# Patient Record
Sex: Male | Born: 1955 | Race: White | Hispanic: No | Marital: Married | Smoking: Never smoker
Health system: Southern US, Community
[De-identification: ages and names within clinical notes are randomized; demographics above are authoritative.]

## PROBLEM LIST (undated history)

## (undated) DIAGNOSIS — I2699 Other pulmonary embolism without acute cor pulmonale: Secondary | ICD-10-CM

## (undated) DIAGNOSIS — I10 Essential (primary) hypertension: Secondary | ICD-10-CM

## (undated) HISTORY — PX: NO PAST SURGERIES: SHX2092

---

## 2006-08-24 ENCOUNTER — Ambulatory Visit: Payer: Self-pay | Admitting: Emergency Medicine

## 2006-08-24 ENCOUNTER — Other Ambulatory Visit: Payer: Self-pay

## 2006-08-24 ENCOUNTER — Emergency Department: Payer: Self-pay | Admitting: General Practice

## 2007-09-18 ENCOUNTER — Ambulatory Visit: Payer: Self-pay | Admitting: Gastroenterology

## 2007-12-08 IMAGING — CR DG CHEST 2V
1 series · 4 of 4 positions shown · non-contrast
Comparison: none

REASON FOR EXAM: chest pain
COMMENTS:

PROCEDURE:     MDR - MDR CHEST PA(OR AP) AND LATERAL  - August 24, 2006 [DATE]
RESULT:     No acute cardiopulmonary disease noted.

[Series 1: view not recorded · 0.17mm/px · 4 of 4 slices shown]
[im 1/4]
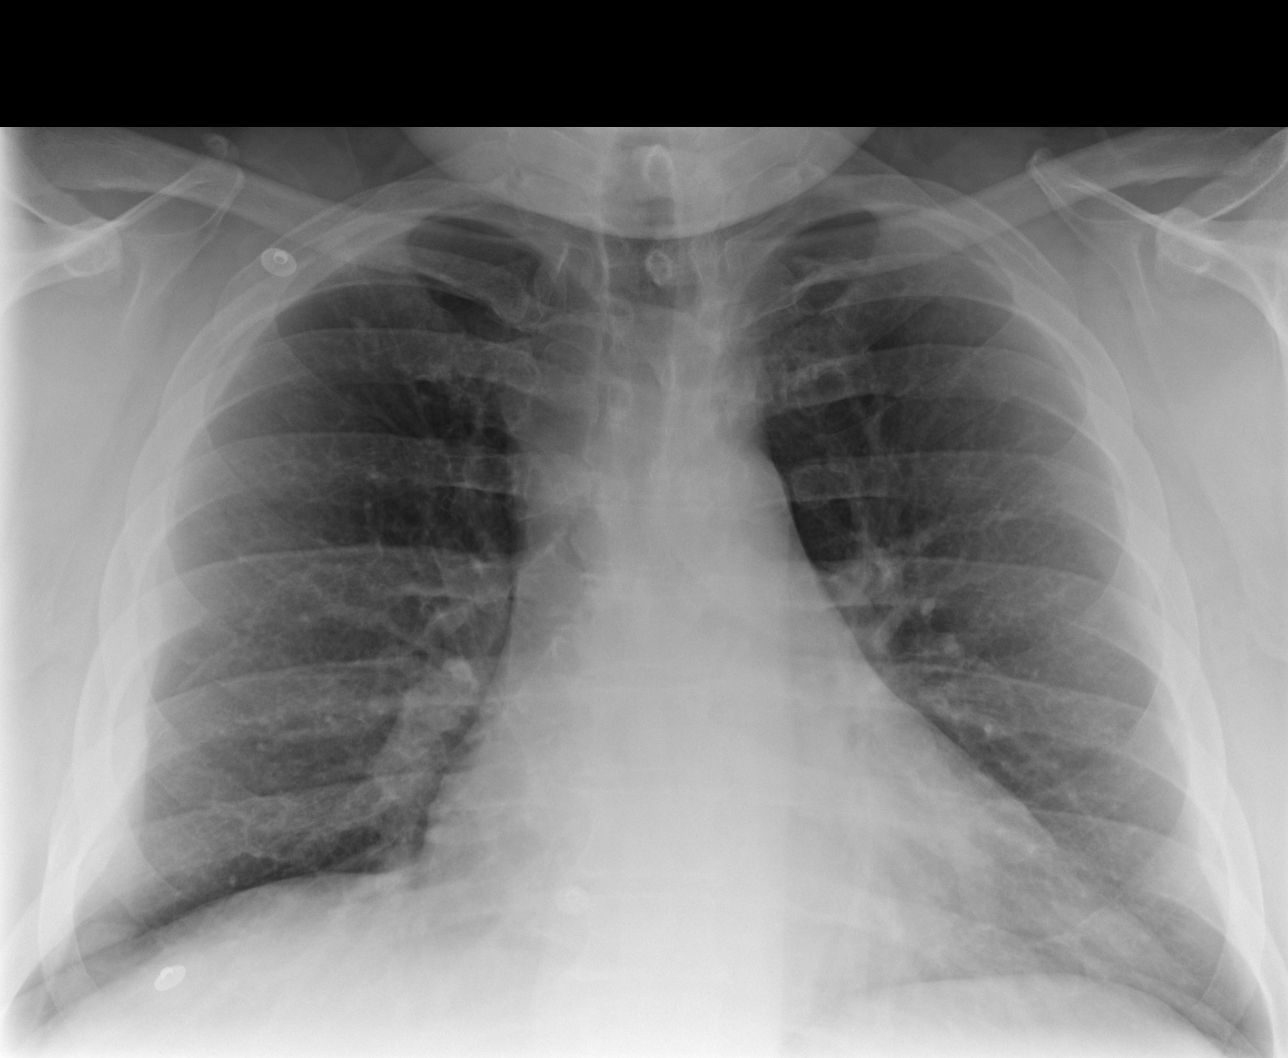
[im 2/4]
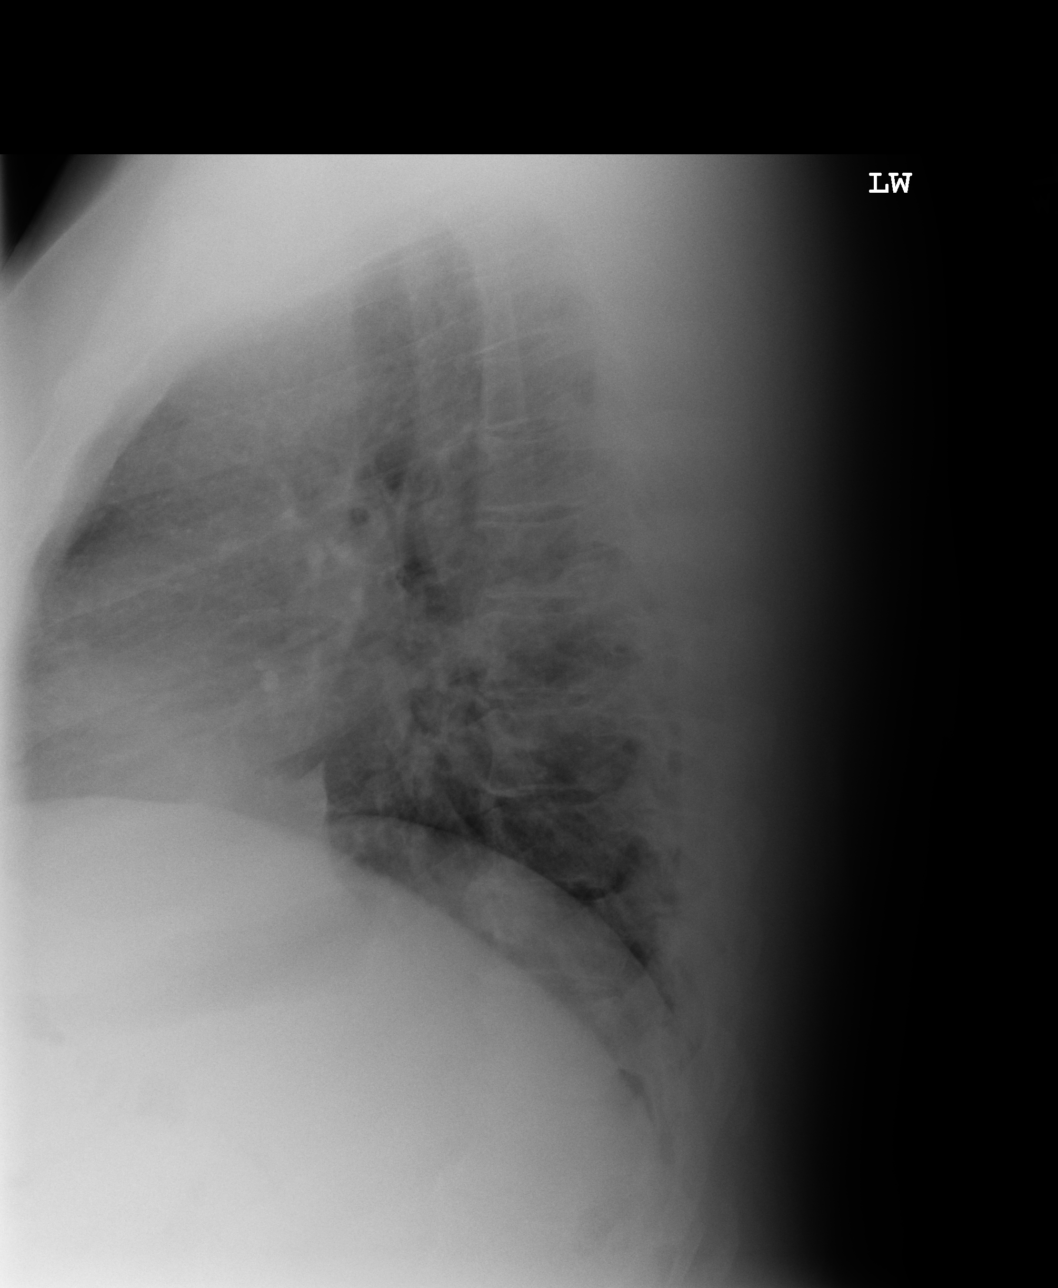
[im 3/4]
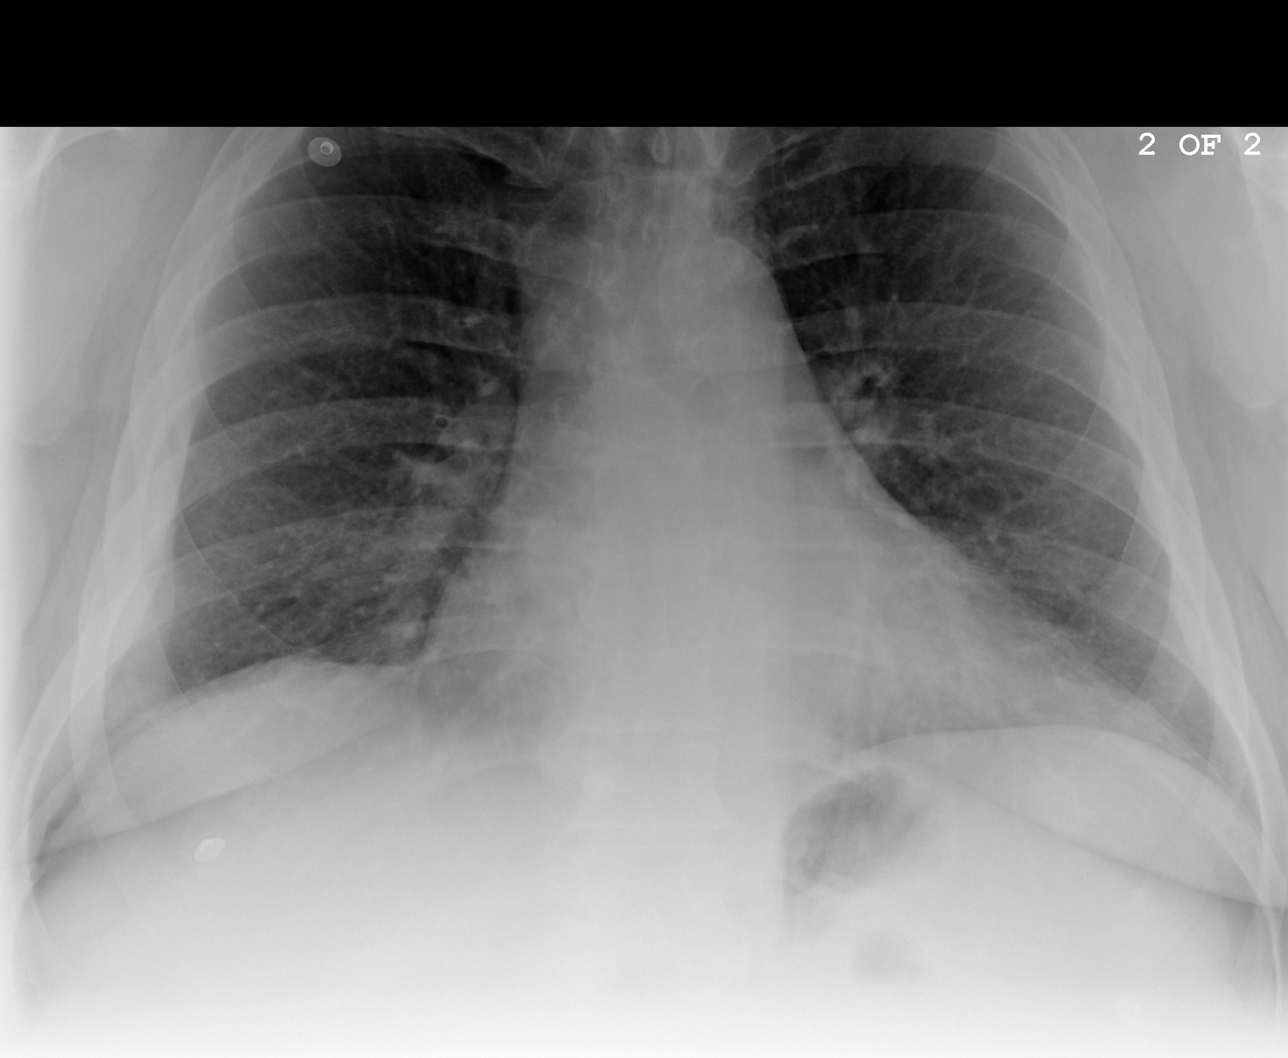
[im 4/4]
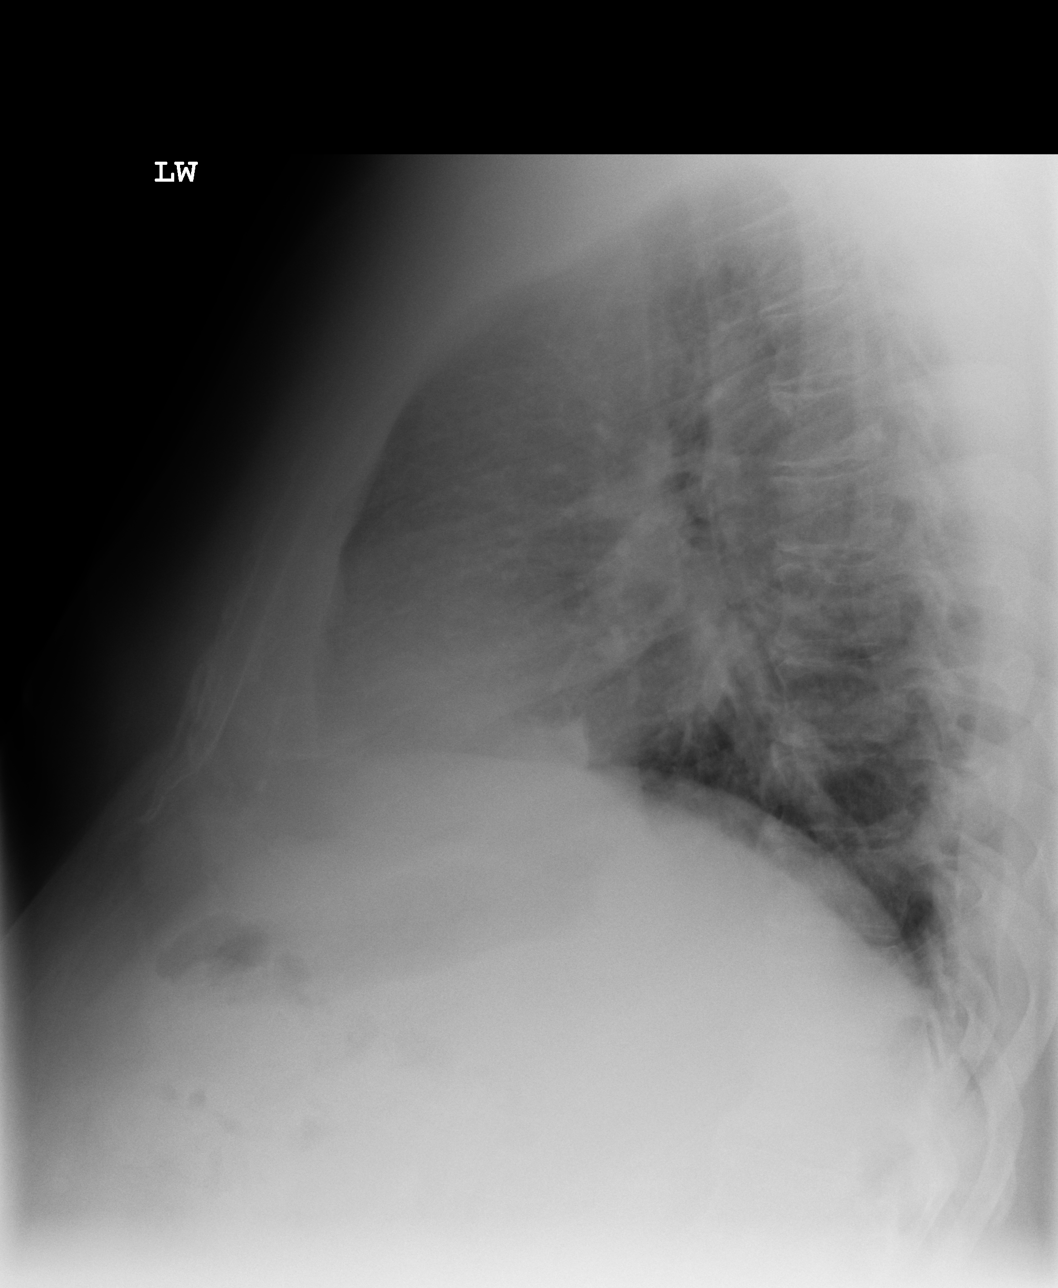

[4 of 4 positions shown; findings below may reference images not displayed]

IMPRESSION: 1)No acute cardiopulmonary disease.

## 2019-05-18 ENCOUNTER — Other Ambulatory Visit: Payer: Self-pay

## 2019-05-18 ENCOUNTER — Ambulatory Visit
Admission: EM | Admit: 2019-05-18 | Discharge: 2019-05-18 | Disposition: A | Discharge status: Home / Self Care | Payer: BC Managed Care – PPO | Attending: Internal Medicine | Admitting: Internal Medicine

## 2019-05-18 DIAGNOSIS — A059 Bacterial foodborne intoxication, unspecified: Secondary | ICD-10-CM | POA: Diagnosis not present

## 2019-05-18 HISTORY — DX: Essential (primary) hypertension: I10

## 2019-05-18 HISTORY — DX: Other pulmonary embolism without acute cor pulmonale: I26.99

## 2019-05-18 NOTE — ED Triage Notes (Signed)
Patient states that this morning he went to [employer] and started having diarrhea after eating. Patient reports that his job sent him home for the diarrhea and they are requiring him to have a doctors note before he is allowed to return.

## 2019-05-18 NOTE — ED Provider Notes (Signed)
[facility]    CSN: [id number] Arrival date & time: 05/18/19  1712      History   Chief Complaint Chief Complaint  Patient presents with  . Diarrhea    HPI Carlos Cunningham is a 64 y.o. male comes to urgent care with 1 day history of diarrhea which started 2 hours after he ate some sausage and biscuit from [employer].  Patient has had several episodes of diarrhea.  He denies any nausea, vomiting or abdominal pain.  Diarrhea has subsided.  No fever or chills.  Oral intake is preserved.  Patient was sent here to be evaluated prior to returning to work.  HPI  Past Medical History:  Diagnosis Date  . Hypertension   . Pulmonary embolism (HCC)     There are no problems to display for this patient.   Past Surgical History:  Procedure Laterality Date  . NO PAST SURGERIES         Home Medications    Prior to Admission medications   Medication Sig Start Date End Date Taking? Authorizing Provider  ELIQUIS 2.5 MG TABS tablet Take 2.5 mg by mouth 2 (two) times daily. 05/13/19  Yes [provider]  hydrochlorothiazide (HYDRODIURIL) 12.5 MG tablet Take 12.5 mg by mouth daily. 03/18/19  Yes [provider]  metFORMIN (GLUCOPHAGE) 1000 MG tablet Take 1,000 mg by mouth 2 (two) times daily. 05/13/19  Yes [provider]  VICTOZA 18 MG/3ML SOPN Inject 1.8 mg into the skin daily. 05/12/19  Yes [provider]    Family History Family History  Problem Relation Age of Onset  . Leukemia Father     Social History Social History   Tobacco Use  . Smoking status: Never Smoker  . Smokeless tobacco: Never Used  Substance Use Topics  . Alcohol use: Never  . Drug use: Never     Allergies   Patient has no known allergies.   Review of Systems Review of Systems  Constitutional: Negative for activity change, chills and fever.  Gastrointestinal: Positive for diarrhea. Negative for abdominal pain, nausea and vomiting.  Musculoskeletal:  Negative.      Physical Exam Triage Vital Signs ED Triage Vitals  Enc Vitals Group     BP 05/18/19 1739 (!) 135/94     Pulse Rate 05/18/19 1739 91     Resp 05/18/19 1739 16     Temp 05/18/19 1739 98.6 F (37 C)     Temp Source 05/18/19 1739 Oral     SpO2 05/18/19 1739 99 %     Weight 05/18/19 1736 274 lb (124.3 kg)     Height 05/18/19 1736 6' (1.829 m)     Head Circumference --      Peak Flow --      Pain Score 05/18/19 1736 0     Pain Loc --      Pain Edu? --      Excl. in GC? --    No data found.  Updated Vital Signs BP (!) 135/94 (BP Location: Right Arm)   Pulse 91   Temp 98.6 F (37 C) (Oral)   Resp 16   Ht 6' (1.829 m)   Wt 124.3 kg   SpO2 99%   BMI 37.16 kg/m   Visual Acuity Right Eye Distance:   Left Eye Distance:   Bilateral Distance:    Right Eye Near:   Left Eye Near:    Bilateral Near:     Physical Exam Vitals and nursing  note reviewed.  Constitutional:      General: He is not in acute distress.    Appearance: Normal appearance. He is not ill-appearing.  Cardiovascular:     Rate and Rhythm: Normal rate and regular rhythm.     Pulses: Normal pulses.     Heart sounds: Normal heart sounds. No murmur. No friction rub.  Pulmonary:     Effort: Pulmonary effort is normal.     Breath sounds: Normal breath sounds. No stridor. No rales.  Chest:     Chest wall: No tenderness.  Neurological:     Mental Status: He is alert.      UC Treatments / Results  Labs (all labs ordered are listed, but only abnormal results are displayed) Labs Reviewed - No data to display  EKG   Radiology No results found.  Procedures Procedures (including critical care time)  Medications Ordered in UC Medications - No data to display  Initial Impression / Assessment and Plan / UC Course  I have reviewed the triage vital signs and the nursing notes.  Pertinent labs & imaging results that were available during my care of the patient were reviewed by me and  considered in my medical decision making (see chart for details).     1.  Food poisoning, resolved: Patient is encouraged to increase oral fluid intake Patient is able to return to work tomorrow.  Final Clinical Impressions(s) / UC Diagnoses   Final diagnoses:  Food poisoning   Discharge Instructions   None    ED Prescriptions    None     PDMP not reviewed this encounter.   Chase Picket, MD 05/18/19 [phone]

## 2019-10-28 ENCOUNTER — Other Ambulatory Visit: Payer: Self-pay

## 2019-10-28 ENCOUNTER — Other Ambulatory Visit: Payer: Self-pay | Admitting: Critical Care Medicine

## 2019-10-28 DIAGNOSIS — Z20822 Contact with and (suspected) exposure to covid-19: Secondary | ICD-10-CM

## 2019-10-29 LAB — NOVEL CORONAVIRUS, NAA: SARS-CoV-2, NAA: DETECTED — AB

## 2019-10-29 LAB — SARS-COV-2, NAA 2 DAY TAT

## 2020-12-28 ENCOUNTER — Other Ambulatory Visit (HOSPITAL_COMMUNITY): Payer: Self-pay

## 2020-12-28 MED ORDER — FLUARIX QUADRIVALENT 0.5 ML IM SUSY
PREFILLED_SYRINGE | INTRAMUSCULAR | 0 refills | Status: AC
  Filled 2020-12-28: qty 0.5, 1d supply, fill #0

## 2021-10-07 ENCOUNTER — Encounter: Payer: Self-pay | Admitting: Emergency Medicine

## 2021-10-07 ENCOUNTER — Ambulatory Visit
Admission: EM | Admit: 2021-10-07 | Discharge: 2021-10-07 | Disposition: A | Discharge status: Home / Self Care | Payer: BC Managed Care – PPO | Attending: Emergency Medicine | Admitting: Emergency Medicine

## 2021-10-07 DIAGNOSIS — U071 COVID-19: Secondary | ICD-10-CM | POA: Insufficient documentation

## 2021-10-07 LAB — SARS CORONAVIRUS 2 BY RT PCR: SARS Coronavirus 2 by RT PCR: POSITIVE — AB

## 2021-10-07 LAB — GROUP A STREP BY PCR: Group A Strep by PCR: NOT DETECTED

## 2021-10-07 MED ORDER — IPRATROPIUM BROMIDE 0.06 % NA SOLN: 2.0000 | Freq: Four times a day (QID) | NASAL | 12 refills | Status: AC

## 2021-10-07 MED ORDER — BENZONATATE 100 MG PO CAPS: 200.0000 mg | ORAL_CAPSULE | Freq: Three times a day (TID) | ORAL | 0 refills | Status: AC

## 2021-10-07 MED ORDER — PROMETHAZINE-DM 6.25-15 MG/5ML PO SYRP: 5.0000 mL | ORAL_SOLUTION | Freq: Four times a day (QID) | ORAL | 0 refills | Status: AC | PRN

## 2021-10-07 MED ORDER — MOLNUPIRAVIR EUA 200MG CAPSULE: 4.0000 | ORAL_CAPSULE | Freq: Two times a day (BID) | ORAL | 0 refills | Status: AC

## 2021-10-07 NOTE — ED Triage Notes (Signed)
Patient c/o sore throat that started yesterday.  Patient reports cold symptoms that started yesterday. Patient denies fevers.

## 2021-10-07 NOTE — Discharge Instructions (Addendum)
You will have to quarantine for 5 days from the start of your symptoms.  After 5 days you can break quarantine if your symptoms have improved and you have not had a fever for 24 hours without taking Tylenol or ibuprofen.  Use the Atrovent nasal spray, 2 squirts in each nostril every 6 hours, as needed for runny nose and postnasal drip.  Use the Tessalon Perles every 8 hours during the day.  Take them with a small sip of water.  They may give you some numbness to the base of your tongue or a metallic taste in your mouth, this is normal.  Use the Promethazine DM cough syrup at bedtime for cough and congestion.  It will make you drowsy so do not take it during the day.  Take the molnupiravir twice daily for 5 days for treatment of COVID-19.   Use the Tessalon Perles during the day as needed for cough and the Promethazine DM cough syrup at nighttime as will make you drowsy.  If you develop any increased shortness of breath-especially at rest, you are unable to speak in full sentences, or is a late sign your lips are turning blue you need to go the ER for evaluation.

## 2021-10-07 NOTE — ED Provider Notes (Signed)
[facility]    CSN: [id number] Arrival date & time: 10/07/21  8182      History   Chief Complaint Chief Complaint  Patient presents with   Sore Throat    HPI Brittan Mapel is a 66 y.o. male.   HPI  66 year old male here for evaluation of sore throat.  Patient reports that he began having a sore throat yesterday morning and that is his primary complaint.  This is associated with sneezing, runny nose, postnasal drip, itchy watery eyes, and a nonproductive cough.  He denies nasal congestion, ear pain, shortness of breath, or wheezing.  He does have a significant past medical history to include hypertension but his blood pressure is 146/79 here in clinic type 2 diabetes, and a pulmonary embolism.  He also is significantly hard of hearing.  He is not wearing hearing aids in clinic.  Past Medical History:  Diagnosis Date   Hypertension    Pulmonary embolism (HCC)     There are no problems to display for this patient.   Past Surgical History:  Procedure Laterality Date   NO PAST SURGERIES         Home Medications    Prior to Admission medications   Medication Sig Start Date End Date Taking? Authorizing Provider  benzonatate (TESSALON) 100 MG capsule Take 2 capsules (200 mg total) by mouth every 8 (eight) hours. 10/07/21  Yes Becky Augusta, NP  chlorthalidone (HYGROTON) 50 MG tablet Take 1 tablet by mouth daily. 06/12/21 06/12/22 Yes [provider]  ELIQUIS 2.5 MG TABS tablet Take 2.5 mg by mouth 2 (two) times daily. 05/13/19  Yes [provider]  glipiZIDE-metformin (METAGLIP) 5-500 MG tablet Take 1 tablet by mouth 2 (two) times daily. 09/23/21  Yes [provider]  hydrochlorothiazide (HYDRODIURIL) 12.5 MG tablet Take 12.5 mg by mouth daily. 03/18/19  Yes [provider]  ipratropium (ATROVENT) 0.06 % nasal spray Place 2 sprays into both nostrils 4 (four) times daily. 10/07/21  Yes Becky Augusta, NP  molnupiravir EUA  (LAGEVRIO) 200 mg CAPS capsule Take 4 capsules (800 mg total) by mouth 2 (two) times daily for 5 days. 10/07/21 10/12/21 Yes Becky Augusta, NP  MOUNJARO 2.5 MG/0.5ML Pen SMARTSIG:2.5 Milligram(s) SUB-Q Once a Week 10/03/21  Yes [provider]  promethazine-dextromethorphan (PROMETHAZINE-DM) 6.25-15 MG/5ML syrup Take 5 mLs by mouth 4 (four) times daily as needed. 10/07/21  Yes Becky Augusta, NP  influenza vac split quadrivalent PF (FLUARIX QUADRIVALENT) 0.5 ML injection Inject into the muscle. 12/28/20   Judyann Munson, MD    Family History Family History  Problem Relation Age of Onset   Leukemia Father     Social History Social History   Tobacco Use   Smoking status: Never   Smokeless tobacco: Never  Vaping Use   Vaping Use: Never used  Substance Use Topics   Alcohol use: Never   Drug use: Never     Allergies   Patient has no known allergies.   Review of Systems Review of Systems  Constitutional:  Negative for fever.  HENT:  Positive for hearing loss, postnasal drip, rhinorrhea and sore throat. Negative for congestion and ear pain.   Respiratory:  Positive for cough. Negative for shortness of breath and wheezing.   Hematological: Negative.   Psychiatric/Behavioral: Negative.       Physical Exam Triage Vital Signs ED Triage Vitals  Enc Vitals Group     BP 10/07/21 0847 (!) 146/79     Pulse Rate  10/07/21 0847 85     Resp 10/07/21 0847 15     Temp 10/07/21 0847 98.2 F (36.8 C)     Temp Source 10/07/21 0847 Oral     SpO2 10/07/21 0847 98 %     Weight 10/07/21 0844 274 lb 0.5 oz (124.3 kg)     Height 10/07/21 0844 6' (1.829 m)     Head Circumference --      Peak Flow --      Pain Score 10/07/21 0844 7     Pain Loc --      Pain Edu? --      Excl. in GC? --    No data found.  Updated Vital Signs BP (!) 146/79 (BP Location: Left Arm)   Pulse 85   Temp 98.2 F (36.8 C) (Oral)   Resp 15   Ht 6' (1.829 m)   Wt 274 lb 0.5 oz (124.3 kg)   SpO2 98%   BMI  37.17 kg/m   Visual Acuity Right Eye Distance:   Left Eye Distance:   Bilateral Distance:    Right Eye Near:   Left Eye Near:    Bilateral Near:     Physical Exam Vitals and nursing note reviewed.  Constitutional:      Appearance: Normal appearance. He is not ill-appearing.  HENT:     Head: Normocephalic and atraumatic.     Right Ear: Tympanic membrane, ear canal and external ear normal. There is no impacted cerumen.     Left Ear: Tympanic membrane, ear canal and external ear normal. There is no impacted cerumen.     Nose: Congestion and rhinorrhea present.     Mouth/Throat:     Mouth: Mucous membranes are moist.     Pharynx: Oropharynx is clear. Posterior oropharyngeal erythema present. No oropharyngeal exudate.  Eyes:     Extraocular Movements: Extraocular movements intact.     Conjunctiva/sclera: Conjunctivae normal.     Pupils: Pupils are equal, round, and reactive to light.  Cardiovascular:     Rate and Rhythm: Normal rate and regular rhythm.     Pulses: Normal pulses.     Heart sounds: Normal heart sounds. No murmur heard.    No friction rub. No gallop.  Pulmonary:     Effort: Pulmonary effort is normal.     Breath sounds: Normal breath sounds. No wheezing, rhonchi or rales.  Musculoskeletal:     Cervical back: Normal range of motion and neck supple.  Lymphadenopathy:     Cervical: Cervical adenopathy present.  Skin:    General: Skin is warm and dry.     Capillary Refill: Capillary refill takes less than 2 seconds.     Findings: No erythema or rash.  Neurological:     General: No focal deficit present.     Mental Status: He is alert and oriented to person, place, and time.  Psychiatric:        Mood and Affect: Mood normal.        Behavior: Behavior normal.        Thought Content: Thought content normal.        Judgment: Judgment normal.      UC Treatments / Results  Labs (all labs ordered are listed, but only abnormal results are displayed) Labs  Reviewed  SARS CORONAVIRUS 2 BY RT PCR - Abnormal; Notable for the following components:      Result Value   SARS Coronavirus 2 by RT PCR POSITIVE (*)  All other components within normal limits  GROUP A STREP BY PCR    EKG   Radiology No results found.  Procedures Procedures (including critical care time)  Medications Ordered in UC Medications - No data to display  Initial Impression / Assessment and Plan / UC Course  I have reviewed the triage vital signs and the nursing notes.  Pertinent labs & imaging results that were available during my care of the patient were reviewed by me and considered in my medical decision making (see chart for details).  Patient is a pleasant, nontoxic-appearing 66 year old male presenting for evaluation of sore throat and other upper respiratory symptoms as outlined in HPI above.  He does have a significant past medical history to include type 2 diabetes, hypertension, and pulmonary embolism.  He is on Eliquis currently.  He reports that his symptoms began yesterday and his Mo significant symptom is his sore throat.  Physical exam reveals pearly-gray tympanic membranes bilaterally with normal light reflex and clear external auditory canals.  Nasal mucosa is erythematous and edematous with clear discharge in both nares.  Oropharyngeal exam reveals mildly erythematous and slightly edematous bilateral tonsillar pillars.  No injection or exudate noted.  Posterior oropharynx does have erythema and mild injection clear postnasal drip.  There is bilateral anterior cervical lymphadenopathy present but the patient states it is not tender to palpation.  Cardiopulmonary exam reveals S1-S2 heart sounds with regular rate and rhythm and lung sounds that are clear to auscultation all fields.  I suspect that the patient sore throat is secondary to his postnasal drip and rhinorrhea.  I will check strep PCR.  Strep PCR is negative.  I will order a COVID PCR.  COVID PCR  is positive.  I will discharge patient home with a diagnosis of COVID-19 and have him quarantine for 5 days from the onset of his symptoms.  I will treat his symptoms with molnupiravir twice daily for 5 days.  I will also prescribe Atrovent nasal spray to help with the nasal congestion and postnasal drip, Tessalon Perles for use during the day for cough, and Promethazine DM cough syrup to use at bedtime.  ER return precautions reviewed.   Final Clinical Impressions(s) / UC Diagnoses   Final diagnoses:  COVID-19     Discharge Instructions      You will have to quarantine for 5 days from the start of your symptoms.  After 5 days you can break quarantine if your symptoms have improved and you have not had a fever for 24 hours without taking Tylenol or ibuprofen.  Use the Atrovent nasal spray, 2 squirts in each nostril every 6 hours, as needed for runny nose and postnasal drip.  Use the Tessalon Perles every 8 hours during the day.  Take them with a small sip of water.  They may give you some numbness to the base of your tongue or a metallic taste in your mouth, this is normal.  Use the Promethazine DM cough syrup at bedtime for cough and congestion.  It will make you drowsy so do not take it during the day.  Take the molnupiravir twice daily for 5 days for treatment of COVID-19.   Use the Tessalon Perles during the day as needed for cough and the Promethazine DM cough syrup at nighttime as will make you drowsy.  If you develop any increased shortness of breath-especially at rest, you are unable to speak in full sentences, or is a late sign your lips are turning  blue you need to go the ER for evaluation.      ED Prescriptions     Medication Sig Dispense Auth. Provider   molnupiravir EUA (LAGEVRIO) 200 mg CAPS capsule Take 4 capsules (800 mg total) by mouth 2 (two) times daily for 5 days. 40 capsule Becky Augusta, NP   benzonatate (TESSALON) 100 MG capsule Take 2 capsules (200 mg total)  by mouth every 8 (eight) hours. 21 capsule Becky Augusta, NP   ipratropium (ATROVENT) 0.06 % nasal spray Place 2 sprays into both nostrils 4 (four) times daily. 15 mL Becky Augusta, NP   promethazine-dextromethorphan (PROMETHAZINE-DM) 6.25-15 MG/5ML syrup Take 5 mLs by mouth 4 (four) times daily as needed. 118 mL Becky Augusta, NP      PDMP not reviewed this encounter.   Becky Augusta, NP 10/07/21 1045

## 2021-12-19 ENCOUNTER — Other Ambulatory Visit (HOSPITAL_BASED_OUTPATIENT_CLINIC_OR_DEPARTMENT_OTHER): Payer: Self-pay

## 2021-12-22 ENCOUNTER — Other Ambulatory Visit (HOSPITAL_BASED_OUTPATIENT_CLINIC_OR_DEPARTMENT_OTHER): Payer: Self-pay

## 2021-12-22 MED ORDER — FLUAD QUADRIVALENT 0.5 ML IM PRSY
PREFILLED_SYRINGE | INTRAMUSCULAR | 0 refills | Status: AC
  Filled 2021-12-22: qty 0.5, 1d supply, fill #0
